# Patient Record
Sex: Male | Born: 2000 | Race: White | Hispanic: No | Marital: Single | Smoking: Never smoker
Health system: Southern US, Community
[De-identification: ages and names within clinical notes are randomized; demographics above are authoritative.]

## PROBLEM LIST (undated history)

## (undated) DIAGNOSIS — G809 Cerebral palsy, unspecified: Secondary | ICD-10-CM

## (undated) HISTORY — PX: OTHER SURGICAL HISTORY: SHX169

---
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
## 2023-04-16 ENCOUNTER — Emergency Department: Payer: Medicaid Other

## 2023-04-16 ENCOUNTER — Emergency Department
Admission: EM | Admit: 2023-04-16 | Discharge: 2023-04-16 | Disposition: A | Discharge status: Home / Self Care | Payer: Medicaid Other | Attending: Emergency Medicine | Admitting: Emergency Medicine

## 2023-04-16 DIAGNOSIS — W2201XA Walked into wall, initial encounter: Secondary | ICD-10-CM | POA: Diagnosis not present

## 2023-04-16 DIAGNOSIS — S6991XA Unspecified injury of right wrist, hand and finger(s), initial encounter: Secondary | ICD-10-CM | POA: Diagnosis present

## 2023-04-16 DIAGNOSIS — S60221A Contusion of right hand, initial encounter: Secondary | ICD-10-CM | POA: Insufficient documentation

## 2023-04-16 MED ORDER — MELOXICAM 15 MG PO TABS: 15.0000 mg | ORAL_TABLET | Freq: Every day | ORAL | 0 refills | Status: AC

## 2023-04-16 MED ORDER — MELOXICAM 15 MG PO TABS: 15.0000 mg | ORAL_TABLET | Freq: Every day | ORAL | 0 refills | Status: DC

## 2023-04-16 NOTE — ED Provider Notes (Signed)
[facility] Provider Note  Patient Contact: 7:18 PM (approximate)   History   Hand Injury   HPI  Jahsai Pennachio is a 22 y.o. male who presents emergency department complaining of pain at the MCP joint of the middle finger right hand.  Patient states he was working on a car, became very frustrated and took a swing at a wall.  Patient hit the wall.  Initially had pain but did not have much bruising or swelling.  Patient states he was working later, felt a sharp pain and his knuckle started to swell.  Patient went to ensure that there was no fracture.  Full range of motion is preserved.  No other injury or complaint.     Physical Exam   Triage Vital Signs: ED Triage Vitals  Enc Vitals Group     BP 04/16/23 1836 (!) 155/79     Pulse Rate 04/16/23 1836 (!) 51     Resp 04/16/23 1836 17     Temp 04/16/23 1836 98.3 F (36.8 C)     Temp Source 04/16/23 1836 Oral     SpO2 04/16/23 1836 99 %     Weight 04/16/23 1837 130 lb (59 kg)     Height --      Head Circumference --      Peak Flow --      Pain Score 04/16/23 1837 6     Pain Loc --      Pain Edu? --      Excl. in GC? --     Most recent vital signs: Vitals:   04/16/23 1836  BP: (!) 155/79  Pulse: (!) 51  Resp: 17  Temp: 98.3 F (36.8 C)  SpO2: 99%     General: Alert and in no acute distress.  Cardiovascular:  Good peripheral perfusion Respiratory: Normal respiratory effort without tachypnea or retractions. Lungs CTAB.  Musculoskeletal: Full range of motion to all extremities.  Visual the right hand reveals slight edema about the MCP joint of the middle finger.  No obvious deformity.  Full range of motion is preserved.  Sensation and cap refill intact. Neurologic:  No gross focal neurologic deficits are appreciated.  Skin:   No rash noted Other:   ED Results / Procedures / Treatments   Labs (all labs ordered are listed, but only abnormal results are displayed) Labs Reviewed - No data  to display   EKG     RADIOLOGY  I personally viewed, evaluated, and interpreted these images as part of my medical decision making, as well as reviewing the written report by the radiologist.  ED Provider Interpretation: No acute traumatic finding X-ray of the hand.  DG Hand Complete Right  Result Date: 04/16/2023 CLINICAL DATA:  Swelling. EXAM: RIGHT HAND - COMPLETE 3+ VIEW COMPARISON:  None Available. FINDINGS: There is no evidence of fracture or dislocation. There is no evidence of arthropathy or other focal bone abnormality. Soft tissues swelling at the level of the third metacarpophalangeal joint. IMPRESSION: No evidence of fracture. Electronically Signed   By: Ted Mcalpine M.D.   On: 04/16/2023 19:05    PROCEDURES:  Critical Care performed: No  Procedures   MEDICATIONS ORDERED IN ED: Medications - No data to display   IMPRESSION / MDM / ASSESSMENT AND PLAN / ED COURSE  I reviewed the triage vital signs and the nursing notes.  Differential diagnosis includes, but is not limited to, hand contusion, hand fracture, ligamentous rupture    Patient's presentation is most consistent with acute presentation with potential threat to life or bodily function.   Patient's diagnosis is consistent with head contusion.  Patient presents emergency department after punching a wall.  Patient had edema about the MCP joint of the middle finger right hand.  Imaging reveals no underlying fracture.  Full range of motion is preserved.  Anti-inflammatory prescribed for the patient.  Follow-up primary care as needed..  Patient is given ED precautions to return to the ED for any worsening or new symptoms.     FINAL CLINICAL IMPRESSION(S) / ED DIAGNOSES   Final diagnoses:  Contusion of right hand, initial encounter     Rx / DC Orders   ED Discharge Orders          Ordered    meloxicam (MOBIC) 15 MG tablet  Daily        04/16/23 1926              Note:  This document was prepared using Dragon voice recognition software and may include unintentional dictation errors.   Lanette Hampshire 04/16/23 1928    Georga Hacking, MD 04/16/23 2026

## 2023-04-16 NOTE — ED Triage Notes (Signed)
Pt sts that he has been having right hand pain of the third knuckle. Per pt he hit a brick wall and since than he has been having pain.

## 2024-01-03 ENCOUNTER — Emergency Department
Admission: EM | Admit: 2024-01-03 | Discharge: 2024-01-03 | Disposition: A | Discharge status: Home / Self Care | Payer: Medicaid Other | Attending: Emergency Medicine | Admitting: Emergency Medicine

## 2024-01-03 ENCOUNTER — Emergency Department: Payer: Medicaid Other

## 2024-01-03 DIAGNOSIS — Z20822 Contact with and (suspected) exposure to covid-19: Secondary | ICD-10-CM | POA: Diagnosis not present

## 2024-01-03 DIAGNOSIS — R509 Fever, unspecified: Secondary | ICD-10-CM | POA: Diagnosis present

## 2024-01-03 DIAGNOSIS — J111 Influenza due to unidentified influenza virus with other respiratory manifestations: Secondary | ICD-10-CM

## 2024-01-03 DIAGNOSIS — R059 Cough, unspecified: Secondary | ICD-10-CM | POA: Insufficient documentation

## 2024-01-03 DIAGNOSIS — M7918 Myalgia, other site: Secondary | ICD-10-CM | POA: Insufficient documentation

## 2024-01-03 LAB — RESP PANEL BY RT-PCR (RSV, FLU A&B, COVID)  RVPGX2
Influenza A by PCR: NEGATIVE
Influenza B by PCR: NEGATIVE
Resp Syncytial Virus by PCR: NEGATIVE
SARS Coronavirus 2 by RT PCR: NEGATIVE

## 2024-01-03 MED ORDER — IBUPROFEN 600 MG PO TABS: 600.0000 mg | ORAL_TABLET | Freq: Three times a day (TID) | ORAL | 0 refills | Status: AC | PRN

## 2024-01-03 MED ORDER — IBUPROFEN 800 MG PO TABS
800.0000 mg | ORAL_TABLET | Freq: Once | ORAL | Status: AC
  Administered 2024-01-03: 800 mg via ORAL
  Filled 2024-01-03: qty 1

## 2024-01-03 MED ORDER — ACETAMINOPHEN 325 MG PO TABS
650.0000 mg | ORAL_TABLET | Freq: Once | ORAL | Status: AC | PRN
Start: 2024-01-03 — End: 2024-01-03
  Administered 2024-01-03: 650 mg via ORAL
  Filled 2024-01-03: qty 2

## 2024-01-03 NOTE — ED Provider Notes (Signed)
[facility] Provider Note    Event Date/Time   First MD Initiated Contact with Patient 01/03/24 [phone]     (approximate)   History   Fever, Generalized Body Aches, and Cough   HPI  Darren Hernandez is a 23 y.o. male who presents today with history of 24 hours of fever, chills, body aches and cough.  Patient states mother has influenza A.     Physical Exam   Triage Vital Signs: ED Triage Vitals  Encounter Vitals Group     BP 01/03/24 1722 132/81     Systolic BP Percentile --      Diastolic BP Percentile --      Pulse Rate 01/03/24 1722 (!) 105     Resp 01/03/24 1722 16     Temp 01/03/24 1722 (!) 101.1 F (38.4 C)     Temp Source 01/03/24 1722 Oral     SpO2 01/03/24 1722 100 %     Weight 01/03/24 1723 129 lb (58.5 kg)     Height 01/03/24 1723 5\' 6"  (1.676 m)     Head Circumference --      Peak Flow --      Pain Score 01/03/24 1723 6     Pain Loc --      Pain Education --      Exclude from Growth Chart --     Most recent vital signs: Vitals:   01/03/24 1722  BP: 132/81  Pulse: (!) 105  Resp: 16  Temp: (!) 101.1 F (38.4 C)  SpO2: 100%     Constitutional: Alert , febrile Eyes: Conjunctivae are normal.  Head: Atraumatic. Nose: No congestion/rhinnorhea. Mouth/Throat: Mucous membranes are moist.  No peritonsillar erythema Neck: Painless ROM.  Cardiovascular:   Good peripheral circulation.  Regular rhythm Respiratory: Normal respiratory effort.  No retractions.  No wheezing Gastrointestinal: Soft and nontender.  Musculoskeletal:  no deformity Neurologic:  MAE spontaneously. No gross focal neurologic deficits are appreciated.  Skin:  Skin is warm, dry and intact. No rash noted. Psychiatric: Mood and affect are normal. Speech and behavior are normal.    ED Results / Procedures / Treatments   Labs (all labs ordered are listed, but only abnormal results are displayed) Labs Reviewed  RESP PANEL BY RT-PCR (RSV, FLU A&B, COVID)   RVPGX2     EKG     RADIOLOGY I independently reviewed and interpreted imaging and agree with radiologists findings.      PROCEDURES:  Critical Care performed:   Procedures   MEDICATIONS ORDERED IN ED: Medications  ibuprofen (ADVIL) tablet 800 mg (has no administration in time range)  acetaminophen (TYLENOL) tablet 650 mg (650 mg Oral Given 01/03/24 1729)     IMPRESSION / MDM / ASSESSMENT AND PLAN / ED COURSE  I reviewed the triage vital signs and the nursing notes.  Differential diagnosis includes, but is not limited to, influenza A, pneumonia, COVID, RSV  Patient's presentation is most consistent with acute complicated illness / injury requiring diagnostic workup.   Patient's diagnosis is consistent with influenza A. I independently reviewed and interpreted imaging and agree with radiologists findings. Labs are negative for influenza but mother is sick with influenza A. I did review the patient's allergies and medications. Patient will be discharged home with prescriptions for ibuprofen. Patient is to follow up with PCP as needed or otherwise directed. Patient is given ED precautions to return to the ED for any worsening or new symptoms. Discussed plan of care with patient,  answered all of patient's questions, Patient agreeable to plan of care. Advised patient to take medications according to the instructions on the label. Discussed possible side effects of new medications. Patient verbalized understanding. Clinical Course as of 01/03/24 1934  Thu Jan 03, 2024  1914 DG Chest 2 View No active cardiopulmonary disease. [AE]    Clinical Course User Index [AE] Gladys Damme, PA-C     FINAL CLINICAL IMPRESSION(S) / ED DIAGNOSES   Final diagnoses:  None     Rx / DC Orders   ED Discharge Orders     None        Note:  This document was prepared using Dragon voice recognition software and may include unintentional dictation errors.   Gladys Damme,  PA-C 01/03/24 1936    Minna Antis, MD 01/03/24 2300

## 2024-01-03 NOTE — ED Triage Notes (Signed)
Pt c/o cough, congestion, chills, and body aches x1 day.    Pt's family member was diagnosed w/ Flu A recently.

## 2024-01-03 NOTE — Discharge Instructions (Addendum)
Have been diagnosed with influenza A.  Take ibuprofen every 8 hours after main meals for fever and body aches.  Please drink plenty of fluids.  Please go to your PCP or come back to ED if you have new symptoms or symptoms worsen

## 2024-01-03 NOTE — ED Provider Triage Note (Signed)
Emergency Medicine Provider Triage Evaluation Note  Darren Hernandez , a 23 y.o. male  was evaluated in triage.  Pt complains of congestion, cough x 1 day. Mother has same symptoms and diagnosed with Influenza A.   Review of Systems  Positive:  Negative:   Physical Exam  There were no vitals taken for this visit. Gen:   Awake, no distress   Resp:  Normal effort  MSK:   Moves extremities without difficulty  Other:    Medical Decision Making  Medically screening exam initiated at 5:22 PM.  Appropriate orders placed.  Sina Lucchesi was informed that the remainder of the evaluation will be completed by another provider, this initial triage assessment does not replace that evaluation, and the importance of remaining in the ED until their evaluation is complete.    Romeo Apple, Marcelino Campos A, PA-C 01/03/24 1723

## 2024-02-11 ENCOUNTER — Encounter: Payer: Self-pay | Admitting: Emergency Medicine

## 2024-02-11 ENCOUNTER — Other Ambulatory Visit: Payer: Self-pay

## 2024-02-11 ENCOUNTER — Emergency Department
Admission: EM | Admit: 2024-02-11 | Discharge: 2024-02-11 | Disposition: A | Discharge status: Home / Self Care | Attending: Emergency Medicine | Admitting: Emergency Medicine

## 2024-02-11 DIAGNOSIS — A084 Viral intestinal infection, unspecified: Secondary | ICD-10-CM | POA: Insufficient documentation

## 2024-02-11 DIAGNOSIS — R110 Nausea: Secondary | ICD-10-CM | POA: Diagnosis present

## 2024-02-11 LAB — RESP PANEL BY RT-PCR (RSV, FLU A&B, COVID)  RVPGX2
Influenza A by PCR: NEGATIVE
Influenza B by PCR: NEGATIVE
Resp Syncytial Virus by PCR: NEGATIVE
SARS Coronavirus 2 by RT PCR: NEGATIVE

## 2024-02-11 MED ORDER — ONDANSETRON 4 MG PO TBDP: 4.0000 mg | ORAL_TABLET | Freq: Three times a day (TID) | ORAL | 0 refills | Status: AC | PRN

## 2024-02-11 NOTE — ED Triage Notes (Signed)
 Patient to ED via POV for body aches, fever of 107 at home, and diarrhea. Ongoing since Sunday.

## 2024-02-11 NOTE — Discharge Instructions (Signed)
 You were seen in the emergency department today for a generalized body aches. Your respiratory panel which includes influenza A, Covid and RSV were negative.   Your symptoms are clinically consistent with viral gastroenteritis. Please review the education packet attached to your discharge papers.   Take tylenol or ibuprofen for pain or fever as directed.   Stay hydrated by drinking plenty of fluids to thin mucus. Get adequate amount of sleep and avoid overexertion. Consider a humidifier at night. Warm teas and a spoonful of honey may help reduce cough frequency. Follow up with your primary care provider as needed. A list has been provided for you below.   Please go to the following website to schedule new (and existing) patient appointments:   [email or web address]   The following is a list of primary care offices in the area who are accepting new patients at this time.  Please reach out to one of them directly and let them know you would like to schedule an appointment to follow up on an Emergency Department visit, and/or to establish a new primary care provider (PCP).  There are likely other primary care clinics in the are who are accepting new patients, but this is an excellent place to start:  [facility] Lead physician: Dr Shirlee Latch [address] [place], [place] [address] [phone]  [facility] Lead Physician: Dr Alba Cory [address], [place], [place] [address] [phone]  [facility]  Lead Physician: Dr Olevia Perches [address] [place], [place] [address] [phone]  [facility] Lead Physician: Dr Sofie Hartigan [address] [place], [place] [address] [phone]  [facility] at [facility] Lead Physician: Dr Bari Edward [address] [place], [place], [place] [address] [phone]

## 2024-02-11 NOTE — ED Provider Notes (Signed)
 [facility] Emergency Department Provider Note     Event Date/Time   First MD Initiated Contact with Patient 02/11/24 1816     (approximate)   History   Generalized Body Aches   HPI  Darren Hernandez is a 23 y.o. male with a past medical history of cerebral palsy presents to the ED for generalized bodyaches x 1 day.  Associated symptoms includes headache and nausea.  Patient has taken ibuprofen for pain with some relief.  Endorses 1 episode of diarrhea today.  Unknown sick contacts. denies cough chest pain or shortness of breath.  No other complaints.     Physical Exam   Triage Vital Signs: ED Triage Vitals  Encounter Vitals Group     BP 02/11/24 1329 (!) 140/76     Systolic BP Percentile --      Diastolic BP Percentile --      Pulse Rate 02/11/24 1329 86     Resp 02/11/24 1329 18     Temp 02/11/24 1329 98.9 F (37.2 C)     Temp Source 02/11/24 1329 Oral     SpO2 02/11/24 1329 99 %     Weight 02/11/24 1330 130 lb (59 kg)     Height 02/11/24 1330 5\' 6"  (1.676 m)     Head Circumference --      Peak Flow --      Pain Score 02/11/24 1330 6     Pain Loc --      Pain Education --      Exclude from Growth Chart --     Most recent vital signs: Vitals:   02/11/24 1329 02/11/24 1743  BP: (!) 140/76 119/72  Pulse: 86 83  Resp: 18 17  Temp: 98.9 F (37.2 C) 98.8 F (37.1 C)  SpO2: 99% 100%    General: Alert and oriented. INAD.  Nontoxic appearing. Skin:  Warm, dry and intact. No rashes or lesions noted.     Head:  NCAT.  Eyes:  PERRLA. EOMI.  CV:  Good peripheral perfusion. RRR.  RESP:  Normal effort. LCTAB. No retractions.  ABD:  No distention. Soft, Non tender.  MSK:   Full ROM in all joints. No swelling, deformity or tenderness.  NEURO: Cranial nerves  intact. No focal deficits. Gait is steady.   ED Results / Procedures / Treatments   Labs (all labs ordered are listed, but only abnormal results are displayed) Labs Reviewed   RESP PANEL BY RT-PCR (RSV, FLU A&B, COVID)  RVPGX2   No results found.  PROCEDURES:  Critical Care performed: No  Procedures   MEDICATIONS ORDERED IN ED: Medications - No data to display   IMPRESSION / MDM / ASSESSMENT AND PLAN / ED COURSE  I reviewed the triage vital signs and the nursing notes.                                 23 y.o. male presents to the emergency department for evaluation and treatment of acute generalized bodyaches. See HPI for further details.   Differential diagnosis includes, but is not limited to viral URI, viral gastroenteritis  Patient's presentation is most consistent with acute complicated illness / injury requiring diagnostic workup.  Patient is alert and oriented.  He is hemodynamically stable and afebrile.  Physical exam findings are as stated above and overall reassuring.  Normal lung exam.  Normal neuroexam.  No indication for imaging at  this time.  Respiratory panel was reassuring.  Patient's symptoms are clinically consistent with viral gastroenteritis.  Patient is in stable condition for discharge.  Prescription for Zofran sent to pharmacy.  Encouraged follow-up with primary care as needed.  A list has been provided.  Patient request work note which was provided.  ED return precautions discussed.  FINAL CLINICAL IMPRESSION(S) / ED DIAGNOSES   Final diagnoses:  Viral gastroenteritis    Rx / DC Orders   ED Discharge Orders          Ordered    ondansetron (ZOFRAN-ODT) 4 MG disintegrating tablet  Every 8 hours PRN        02/11/24 1859             Note:  This document was prepared using Dragon voice recognition software and may include unintentional dictation errors.    Romeo Apple, Rudolph Daoust A, PA-C 02/11/24 Rowland Lathe, MD 02/11/24 [phone]
